# Patient Record
Sex: Male | Born: 1990 | Race: Black or African American | Hispanic: No | Marital: Single | Smoking: Current every day smoker
Health system: Southern US, Community
[De-identification: ages and names within clinical notes are randomized; demographics above are authoritative.]

## PROBLEM LIST (undated history)

## (undated) DIAGNOSIS — S21139A Puncture wound without foreign body of unspecified front wall of thorax without penetration into thoracic cavity, initial encounter: Secondary | ICD-10-CM

## (undated) DIAGNOSIS — R062 Wheezing: Secondary | ICD-10-CM

## (undated) DIAGNOSIS — Z5189 Encounter for other specified aftercare: Secondary | ICD-10-CM

## (undated) DIAGNOSIS — G931 Anoxic brain damage, not elsewhere classified: Secondary | ICD-10-CM

## (undated) DIAGNOSIS — R531 Weakness: Secondary | ICD-10-CM

## (undated) DIAGNOSIS — F319 Bipolar disorder, unspecified: Secondary | ICD-10-CM

## (undated) DIAGNOSIS — IMO0001 Reserved for inherently not codable concepts without codable children: Secondary | ICD-10-CM

## (undated) DIAGNOSIS — R6883 Chills (without fever): Secondary | ICD-10-CM

## (undated) HISTORY — DX: Bipolar disorder, unspecified: F31.9

## (undated) HISTORY — DX: Anoxic brain damage, not elsewhere classified: G93.1

## (undated) HISTORY — DX: Chills (without fever): R68.83

## (undated) HISTORY — DX: Puncture wound without foreign body of unspecified front wall of thorax without penetration into thoracic cavity, initial encounter: S21.139A

## (undated) HISTORY — DX: Reserved for inherently not codable concepts without codable children: IMO0001

## (undated) HISTORY — DX: Wheezing: R06.2

## (undated) HISTORY — DX: Weakness: R53.1

## (undated) HISTORY — DX: Encounter for other specified aftercare: Z51.89

---

## 2010-11-16 ENCOUNTER — Inpatient Hospital Stay (HOSPITAL_COMMUNITY)
Admission: RE | Admit: 2010-11-16 | Discharge: 2010-11-16 | Disposition: A | Discharge status: Home / Self Care | Payer: Self-pay | Source: Ambulatory Visit | Attending: Emergency Medicine | Admitting: Emergency Medicine

## 2011-03-01 ENCOUNTER — Inpatient Hospital Stay (HOSPITAL_COMMUNITY)
Admission: EM | Admit: 2011-03-01 | Discharge: 2011-03-06 | DRG: 237 | Disposition: A | Discharge status: Home Health | Payer: Medicaid Other | Attending: Surgery | Admitting: Surgery

## 2011-03-01 ENCOUNTER — Emergency Department (HOSPITAL_COMMUNITY): Payer: Medicaid Other

## 2011-03-01 ENCOUNTER — Inpatient Hospital Stay (HOSPITAL_COMMUNITY): Payer: Medicaid Other

## 2011-03-01 DIAGNOSIS — D62 Acute posthemorrhagic anemia: Secondary | ICD-10-CM | POA: Diagnosis present

## 2011-03-01 DIAGNOSIS — F319 Bipolar disorder, unspecified: Secondary | ICD-10-CM | POA: Diagnosis present

## 2011-03-01 DIAGNOSIS — S2190XA Unspecified open wound of unspecified part of thorax, initial encounter: Secondary | ICD-10-CM

## 2011-03-01 DIAGNOSIS — T794XXA Traumatic shock, initial encounter: Secondary | ICD-10-CM

## 2011-03-01 DIAGNOSIS — S21109A Unspecified open wound of unspecified front wall of thorax without penetration into thoracic cavity, initial encounter: Secondary | ICD-10-CM

## 2011-03-01 DIAGNOSIS — S271XXA Traumatic hemothorax, initial encounter: Secondary | ICD-10-CM | POA: Diagnosis present

## 2011-03-01 DIAGNOSIS — I314 Cardiac tamponade: Secondary | ICD-10-CM | POA: Diagnosis present

## 2011-03-01 DIAGNOSIS — Y9229 Other specified public building as the place of occurrence of the external cause: Secondary | ICD-10-CM

## 2011-03-01 DIAGNOSIS — S25809A Unspecified injury of other blood vessels of thorax, unspecified side, initial encounter: Secondary | ICD-10-CM | POA: Diagnosis present

## 2011-03-01 DIAGNOSIS — F17200 Nicotine dependence, unspecified, uncomplicated: Secondary | ICD-10-CM | POA: Diagnosis present

## 2011-03-01 DIAGNOSIS — I498 Other specified cardiac arrhythmias: Secondary | ICD-10-CM | POA: Diagnosis not present

## 2011-03-01 DIAGNOSIS — G931 Anoxic brain damage, not elsewhere classified: Secondary | ICD-10-CM | POA: Diagnosis present

## 2011-03-01 DIAGNOSIS — S2690XA Unspecified injury of heart, unspecified with or without hemopericardium, initial encounter: Secondary | ICD-10-CM

## 2011-03-01 DIAGNOSIS — Y998 Other external cause status: Secondary | ICD-10-CM

## 2011-03-01 HISTORY — PX: CARDIAC SURGERY: SHX584

## 2011-03-01 HISTORY — PX: OTHER SURGICAL HISTORY: SHX169

## 2011-03-01 LAB — COMPREHENSIVE METABOLIC PANEL
ALT: 5 U/L (ref 0–53)
AST: 11 U/L (ref 0–37)
Alkaline Phosphatase: 40 U/L (ref 39–117)
CO2: 16 mEq/L — ABNORMAL LOW (ref 19–32)
GFR calc Af Amer: >60 mL/min (ref >60)
Glucose, Bld: 170 mg/dL — ABNORMAL HIGH (ref 70–99)
Potassium: 2.8 mEq/L — ABNORMAL LOW (ref 3.5–5.1)
Sodium: 143 mEq/L (ref 135–145)
Total Protein: 4.3 g/dL — ABNORMAL LOW (ref 6.0–8.3)

## 2011-03-01 LAB — CBC
HCT: 36.8 % — ABNORMAL LOW (ref 39.0–52.0)
HCT: 33.9 % — ABNORMAL LOW (ref 39.0–52.0)
Hemoglobin: 12.7 g/dL — ABNORMAL LOW (ref 13.0–17.0)
MCH: 30 pg (ref 26.0–34.0)
MCV: 88 fL (ref 78.0–100.0)
MCV: 85.4 fL (ref 78.0–100.0)
Platelets: 143 10*3/uL — ABNORMAL LOW (ref 150–400)
RDW: 12.5 % (ref 11.5–15.5)
RDW: 12.5 % (ref 11.5–15.5)
WBC: 18.2 10*3/uL — ABNORMAL HIGH (ref 4.0–10.5)
WBC: 11.7 10*3/uL — ABNORMAL HIGH (ref 4.0–10.5)

## 2011-03-01 LAB — BASIC METABOLIC PANEL
BUN: 7 mg/dL (ref 6–23)
Calcium: 6.2 mg/dL — CL (ref 8.4–10.5)
Chloride: 112 mEq/L (ref 96–112)
Creatinine, Ser: 0.73 mg/dL (ref 0.50–1.35)
GFR calc Af Amer: >60 mL/min (ref >60)

## 2011-03-01 LAB — POCT I-STAT 3, ART BLOOD GAS (G3+)
Patient temperature: 34.4
TCO2: 22 mmol/L (ref 0–100)
pCO2 arterial: 29.2 mmHg — ABNORMAL LOW (ref 35.0–45.0)
pH, Arterial: 7.455 — ABNORMAL HIGH (ref 7.350–7.450)

## 2011-03-01 LAB — POCT I-STAT 4, (NA,K, GLUC, HGB,HCT)
Glucose, Bld: 122 mg/dL — ABNORMAL HIGH (ref 70–99)
Potassium: 4.3 meq/L (ref 3.5–5.1)
Sodium: 144 meq/L (ref 135–145)

## 2011-03-01 LAB — POCT I-STAT, CHEM 8
BUN: 5 mg/dL — ABNORMAL LOW (ref 6–23)
Chloride: 111 meq/L (ref 96–112)
Creatinine, Ser: 0.9 mg/dL (ref 0.50–1.35)
Glucose, Bld: 163 mg/dL — ABNORMAL HIGH (ref 70–99)
Potassium: 2.9 meq/L — ABNORMAL LOW (ref 3.5–5.1)

## 2011-03-01 LAB — URINALYSIS, ROUTINE W REFLEX MICROSCOPIC
Bilirubin Urine: NEGATIVE
Glucose, UA: 100 mg/dL — AB
Specific Gravity, Urine: 1.03 (ref 1.005–1.030)
Urobilinogen, UA: 1 mg/dL (ref 0.0–1.0)
pH: 7 (ref 5.0–8.0)

## 2011-03-01 LAB — HEMOGLOBIN AND HEMATOCRIT, BLOOD: HCT: 31.2 % — ABNORMAL LOW (ref 39.0–52.0)

## 2011-03-01 LAB — URINE MICROSCOPIC-ADD ON

## 2011-03-01 LAB — LACTIC ACID, PLASMA: Lactic Acid, Venous: 7.5 mmol/L — ABNORMAL HIGH (ref 0.5–2.2)

## 2011-03-01 LAB — ABO/RH: ABO/RH(D): B POS

## 2011-03-01 MED ORDER — IOHEXOL 300 MG/ML  SOLN
100.0000 mL | Freq: Once | INTRAMUSCULAR | Status: AC | PRN
  Administered 2011-03-01: 100 mL via INTRAVENOUS

## 2011-03-02 ENCOUNTER — Inpatient Hospital Stay (HOSPITAL_COMMUNITY): Payer: Medicaid Other

## 2011-03-02 DIAGNOSIS — J9600 Acute respiratory failure, unspecified whether with hypoxia or hypercapnia: Secondary | ICD-10-CM

## 2011-03-02 DIAGNOSIS — F39 Unspecified mood [affective] disorder: Secondary | ICD-10-CM

## 2011-03-02 LAB — BASIC METABOLIC PANEL
BUN: 7 mg/dL (ref 6–23)
CO2: 25 mEq/L (ref 19–32)
Calcium: 7.3 mg/dL — ABNORMAL LOW (ref 8.4–10.5)
Chloride: 111 mEq/L (ref 96–112)
Creatinine, Ser: 0.84 mg/dL (ref 0.50–1.35)
GFR calc Af Amer: >60 mL/min (ref >60)
GFR calc non Af Amer: >60 mL/min (ref >60)
Glucose, Bld: 108 mg/dL — ABNORMAL HIGH (ref 70–99)
Potassium: 3.5 mEq/L (ref 3.5–5.1)
Sodium: 141 mEq/L (ref 135–145)

## 2011-03-02 LAB — CBC
HCT: 32.8 % — ABNORMAL LOW (ref 39.0–52.0)
Hemoglobin: 11.6 g/dL — ABNORMAL LOW (ref 13.0–17.0)
MCH: 30.4 pg (ref 26.0–34.0)
MCHC: 35.4 g/dL (ref 30.0–36.0)
MCV: 86.1 fL (ref 78.0–100.0)
Platelets: 134 10*3/uL — ABNORMAL LOW (ref 150–400)
RBC: 3.81 MIL/uL — ABNORMAL LOW (ref 4.22–5.81)
RDW: 12.8 % (ref 11.5–15.5)
WBC: 11 10*3/uL — ABNORMAL HIGH (ref 4.0–10.5)

## 2011-03-02 LAB — POCT I-STAT 3, ART BLOOD GAS (G3+)
Acid-base deficit: 12 mmol/L — ABNORMAL HIGH (ref 0.0–2.0)
Bicarbonate: 23.9 meq/L (ref 20.0–24.0)
O2 Saturation: 100 %
O2 Saturation: 100 %
pCO2 arterial: 34 mmHg — ABNORMAL LOW (ref 35.0–45.0)
pCO2 arterial: 44.9 mmHg (ref 35.0–45.0)
pH, Arterial: 7.422 (ref 7.350–7.450)
pO2, Arterial: 205 mmHg — ABNORMAL HIGH (ref 80.0–100.0)

## 2011-03-02 LAB — MRSA PCR SCREENING: MRSA by PCR: NEGATIVE

## 2011-03-02 LAB — POCT I-STAT 4, (NA,K, GLUC, HGB,HCT)
Glucose, Bld: 241 mg/dL — ABNORMAL HIGH (ref 70–99)
HCT: 31 % — ABNORMAL LOW (ref 39.0–52.0)
Sodium: 140 meq/L (ref 135–145)

## 2011-03-02 LAB — URINE CULTURE
Colony Count: NO GROWTH
Culture  Setup Time: 201208302017

## 2011-03-02 LAB — MAGNESIUM: Magnesium: 1.4 mg/dL — ABNORMAL LOW (ref 1.5–2.5)

## 2011-03-03 ENCOUNTER — Inpatient Hospital Stay (HOSPITAL_COMMUNITY): Payer: Medicaid Other

## 2011-03-03 LAB — CBC
HCT: 37.8 % — ABNORMAL LOW (ref 39.0–52.0)
Hemoglobin: 13.2 g/dL (ref 13.0–17.0)
MCH: 30.3 pg (ref 26.0–34.0)
MCHC: 34.9 g/dL (ref 30.0–36.0)
MCV: 86.9 fL (ref 78.0–100.0)
Platelets: 152 10*3/uL (ref 150–400)
RBC: 4.35 MIL/uL (ref 4.22–5.81)
RDW: 12.7 % (ref 11.5–15.5)
WBC: 13.7 10*3/uL — ABNORMAL HIGH (ref 4.0–10.5)

## 2011-03-03 LAB — BASIC METABOLIC PANEL
BUN: 4 mg/dL — ABNORMAL LOW (ref 6–23)
CO2: 28 mEq/L (ref 19–32)
Calcium: 8.4 mg/dL (ref 8.4–10.5)
Chloride: 106 mEq/L (ref 96–112)
Creatinine, Ser: 0.66 mg/dL (ref 0.50–1.35)
GFR calc Af Amer: >60 mL/min (ref >60)
GFR calc non Af Amer: >60 mL/min (ref >60)
Glucose, Bld: 115 mg/dL — ABNORMAL HIGH (ref 70–99)
Potassium: 4.1 mEq/L (ref 3.5–5.1)
Sodium: 137 mEq/L (ref 135–145)

## 2011-03-04 ENCOUNTER — Inpatient Hospital Stay (HOSPITAL_COMMUNITY): Payer: Medicaid Other

## 2011-03-04 LAB — TYPE AND SCREEN
Unit division: 0
Unit division: 0
Unit division: 0

## 2011-03-04 LAB — CBC
HCT: 35.8 % — ABNORMAL LOW (ref 39.0–52.0)
Hemoglobin: 12.8 g/dL — ABNORMAL LOW (ref 13.0–17.0)
MCH: 30.9 pg (ref 26.0–34.0)
MCHC: 35.8 g/dL (ref 30.0–36.0)
MCV: 86.5 fL (ref 78.0–100.0)
Platelets: 148 10*3/uL — ABNORMAL LOW (ref 150–400)
RBC: 4.14 MIL/uL — ABNORMAL LOW (ref 4.22–5.81)
RDW: 12.4 % (ref 11.5–15.5)
WBC: 11 10*3/uL — ABNORMAL HIGH (ref 4.0–10.5)

## 2011-03-04 LAB — BASIC METABOLIC PANEL
BUN: 5 mg/dL — ABNORMAL LOW (ref 6–23)
CO2: 29 mEq/L (ref 19–32)
Calcium: 9 mg/dL (ref 8.4–10.5)
Chloride: 104 mEq/L (ref 96–112)
Creatinine, Ser: 0.73 mg/dL (ref 0.50–1.35)
GFR calc Af Amer: >60 mL/min (ref >60)
GFR calc non Af Amer: >60 mL/min (ref >60)
Glucose, Bld: 95 mg/dL (ref 70–99)
Potassium: 3.8 mEq/L (ref 3.5–5.1)
Sodium: 139 mEq/L (ref 135–145)
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
## 2011-03-05 LAB — BASIC METABOLIC PANEL
BUN: 5 mg/dL — ABNORMAL LOW (ref 6–23)
CO2: 32 mEq/L (ref 19–32)
Calcium: 8.9 mg/dL (ref 8.4–10.5)
Chloride: 103 mEq/L (ref 96–112)
Creatinine, Ser: 0.73 mg/dL (ref 0.50–1.35)
GFR calc Af Amer: >60 mL/min (ref >60)
GFR calc non Af Amer: >60 mL/min (ref >60)
Glucose, Bld: 102 mg/dL — ABNORMAL HIGH (ref 70–99)
Potassium: 3.8 mEq/L (ref 3.5–5.1)
Sodium: 140 mEq/L (ref 135–145)

## 2011-03-05 LAB — CBC
HCT: 35.2 % — ABNORMAL LOW (ref 39.0–52.0)
Hemoglobin: 12.5 g/dL — ABNORMAL LOW (ref 13.0–17.0)
MCH: 30.5 pg (ref 26.0–34.0)
MCHC: 35.5 g/dL (ref 30.0–36.0)
MCV: 85.9 fL (ref 78.0–100.0)
Platelets: 172 10*3/uL (ref 150–400)
RBC: 4.1 MIL/uL — ABNORMAL LOW (ref 4.22–5.81)
RDW: 12 % (ref 11.5–15.5)
WBC: 9 10*3/uL (ref 4.0–10.5)

## 2011-03-06 LAB — BASIC METABOLIC PANEL
BUN: 6 mg/dL (ref 6–23)
CO2: 33 mEq/L — ABNORMAL HIGH (ref 19–32)
Calcium: 9.7 mg/dL (ref 8.4–10.5)
Chloride: 102 mEq/L (ref 96–112)
Creatinine, Ser: 0.79 mg/dL (ref 0.50–1.35)
GFR calc Af Amer: >60 mL/min (ref >60)
GFR calc non Af Amer: >60 mL/min (ref >60)
Glucose, Bld: 85 mg/dL (ref 70–99)
Potassium: 3.4 mEq/L — ABNORMAL LOW (ref 3.5–5.1)
Sodium: 141 mEq/L (ref 135–145)

## 2011-03-06 LAB — PREPARE FRESH FROZEN PLASMA: Unit division: 0
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
## 2011-03-08 ENCOUNTER — Ambulatory Visit (INDEPENDENT_AMBULATORY_CARE_PROVIDER_SITE_OTHER): Payer: Self-pay | Admitting: Orthopedic Surgery

## 2011-03-08 ENCOUNTER — Encounter (INDEPENDENT_AMBULATORY_CARE_PROVIDER_SITE_OTHER): Payer: Self-pay

## 2011-03-08 VITALS — BP 128/78 | HR 82 | Temp 97.5°F | Ht 73.0 in | Wt 133.0 lb

## 2011-03-08 DIAGNOSIS — S21119A Laceration without foreign body of unspecified front wall of thorax without penetration into thoracic cavity, initial encounter: Secondary | ICD-10-CM

## 2011-03-08 DIAGNOSIS — S21109A Unspecified open wound of unspecified front wall of thorax without penetration into thoracic cavity, initial encounter: Secondary | ICD-10-CM

## 2011-03-08 NOTE — Patient Instructions (Signed)
Keep appointment with Dr. Tyrone Sage and make appointment with mental health provider as soon as possible.
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
## 2011-03-20 ENCOUNTER — Other Ambulatory Visit: Payer: Self-pay | Admitting: Cardiothoracic Surgery

## 2011-03-20 DIAGNOSIS — I309 Acute pericarditis, unspecified: Secondary | ICD-10-CM

## 2011-03-21 DIAGNOSIS — F319 Bipolar disorder, unspecified: Secondary | ICD-10-CM

## 2011-03-21 DIAGNOSIS — G931 Anoxic brain damage, not elsewhere classified: Secondary | ICD-10-CM | POA: Insufficient documentation

## 2011-03-21 DIAGNOSIS — S21139A Puncture wound without foreign body of unspecified front wall of thorax without penetration into thoracic cavity, initial encounter: Secondary | ICD-10-CM

## 2011-03-22 ENCOUNTER — Encounter: Payer: Self-pay | Admitting: Cardiothoracic Surgery

## 2011-03-28 ENCOUNTER — Other Ambulatory Visit: Payer: Self-pay | Admitting: Cardiothoracic Surgery

## 2011-03-28 DIAGNOSIS — S2190XA Unspecified open wound of unspecified part of thorax, initial encounter: Secondary | ICD-10-CM

## 2011-04-02 ENCOUNTER — Ambulatory Visit (INDEPENDENT_AMBULATORY_CARE_PROVIDER_SITE_OTHER): Payer: Self-pay | Admitting: Physician Assistant

## 2011-04-02 ENCOUNTER — Encounter: Payer: Self-pay | Admitting: Physician Assistant

## 2011-04-02 ENCOUNTER — Ambulatory Visit
Admission: RE | Admit: 2011-04-02 | Discharge: 2011-04-02 | Disposition: A | Discharge status: Home / Self Care | Payer: No Typology Code available for payment source | Source: Ambulatory Visit | Attending: Cardiothoracic Surgery | Admitting: Cardiothoracic Surgery

## 2011-04-02 VITALS — BP 115/75 | HR 72 | Resp 16 | Ht 71.0 in | Wt 140.0 lb

## 2011-04-02 DIAGNOSIS — S21139A Puncture wound without foreign body of unspecified front wall of thorax without penetration into thoracic cavity, initial encounter: Secondary | ICD-10-CM

## 2011-04-02 DIAGNOSIS — S2190XA Unspecified open wound of unspecified part of thorax, initial encounter: Secondary | ICD-10-CM

## 2011-04-02 DIAGNOSIS — S279XXA Injury of unspecified intrathoracic organ, initial encounter: Secondary | ICD-10-CM

## 2011-04-02 NOTE — Progress Notes (Signed)
HPI  [name] is status post median sternotomy for mediastinal exploration for bleeding with evacuation of pericardial hematoma and repair of cardiac injury to the right ventrical done by Dr. Tyrone Sage the 30 2012.  Patient presents today for a followup visit.  He was seen by the trauma surgeons since discharge.  He has been released from their office.  Patient does complain of some right shoulder pain today but denies any recent injury to it.  He does state he tried to change to a tire yesterday but was unable to secondary to shoulder pain.  He is up ambulating several times per day a home.  He's tolerating diet well.  He denies any significant incisional pain, shortness of breath, fevers, opening or drainage from any of his incision sites.  Current Outpatient Prescriptions  Medication Sig Dispense Refill  . citalopram (CELEXA) 20 MG tablet Take 20 mg by mouth daily.        Marland Kitchen oxycodone (OXY-IR) 5 MG capsule Take 5 mg by mouth every 4 (four) hours as needed.              Physical Exam  Constitutional: He is oriented to person, place, and time. He appears well-developed and well-nourished.  Cardiovascular: Normal rate, regular rhythm, normal heart sounds and intact distal pulses.   Pulmonary/Chest: Effort normal and breath sounds normal.  Abdominal: Soft. Bowel sounds are normal.  Musculoskeletal:       + tenderness right shoulder No edema noted  Neurological: He is alert and oriented to person, place, and time. He has normal reflexes.  Skin: Skin is warm and dry.       All incisions are clean dry and intact and healing well.   BP 115/75  Pulse 72  Resp 16  Ht 5\' 11"  (1.803 m)  Wt 140 lb (63.504 kg)  BMI 19.53 kg/m2  SpO2 99%   Diagnostic Tests: Patient had a PA and lateral chest x-ray obtained today noted to be stable.  There is no effusion, pneumothorax, or atelectasis noted.  Sternal wires intact.  Impression/Plan:  Patient is progressing well following surgery.  He is to  continue the anterior 4 times per day progressing.  It is okay for patient to start very short distances at this time.  He still noted lifting over 10 pounds for another 6 weeks.  Patient is told if he continues to have right shoulder discomfort he'll need followup by his primary care physician.  I did give patient prescription for Vicodin 5/500 total number of 40.  At this time we'll release patient from the practice but he is to  contact us if he has a surgical concerns.   Patient is in agreement.

## 2011-04-02 NOTE — Patient Instructions (Signed)
Continue bleeding 3 or 4 times per day progressing.  Okay to start driving short distances.  No heavy lifting over 10 pounds for another 6 weeks.  Follow up with primary care physician if right shoulder discomfort does not improve.
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
## 2013-06-07 IMAGING — CR DG CHEST 1V PORT
1 series · 1 of 1 positions shown · non-contrast
Comparison: 03/01/2011 at [DATE] p.m.

CLINICAL DATA: Line placement

PORTABLE CHEST - 1 VIEW

[view not recorded]
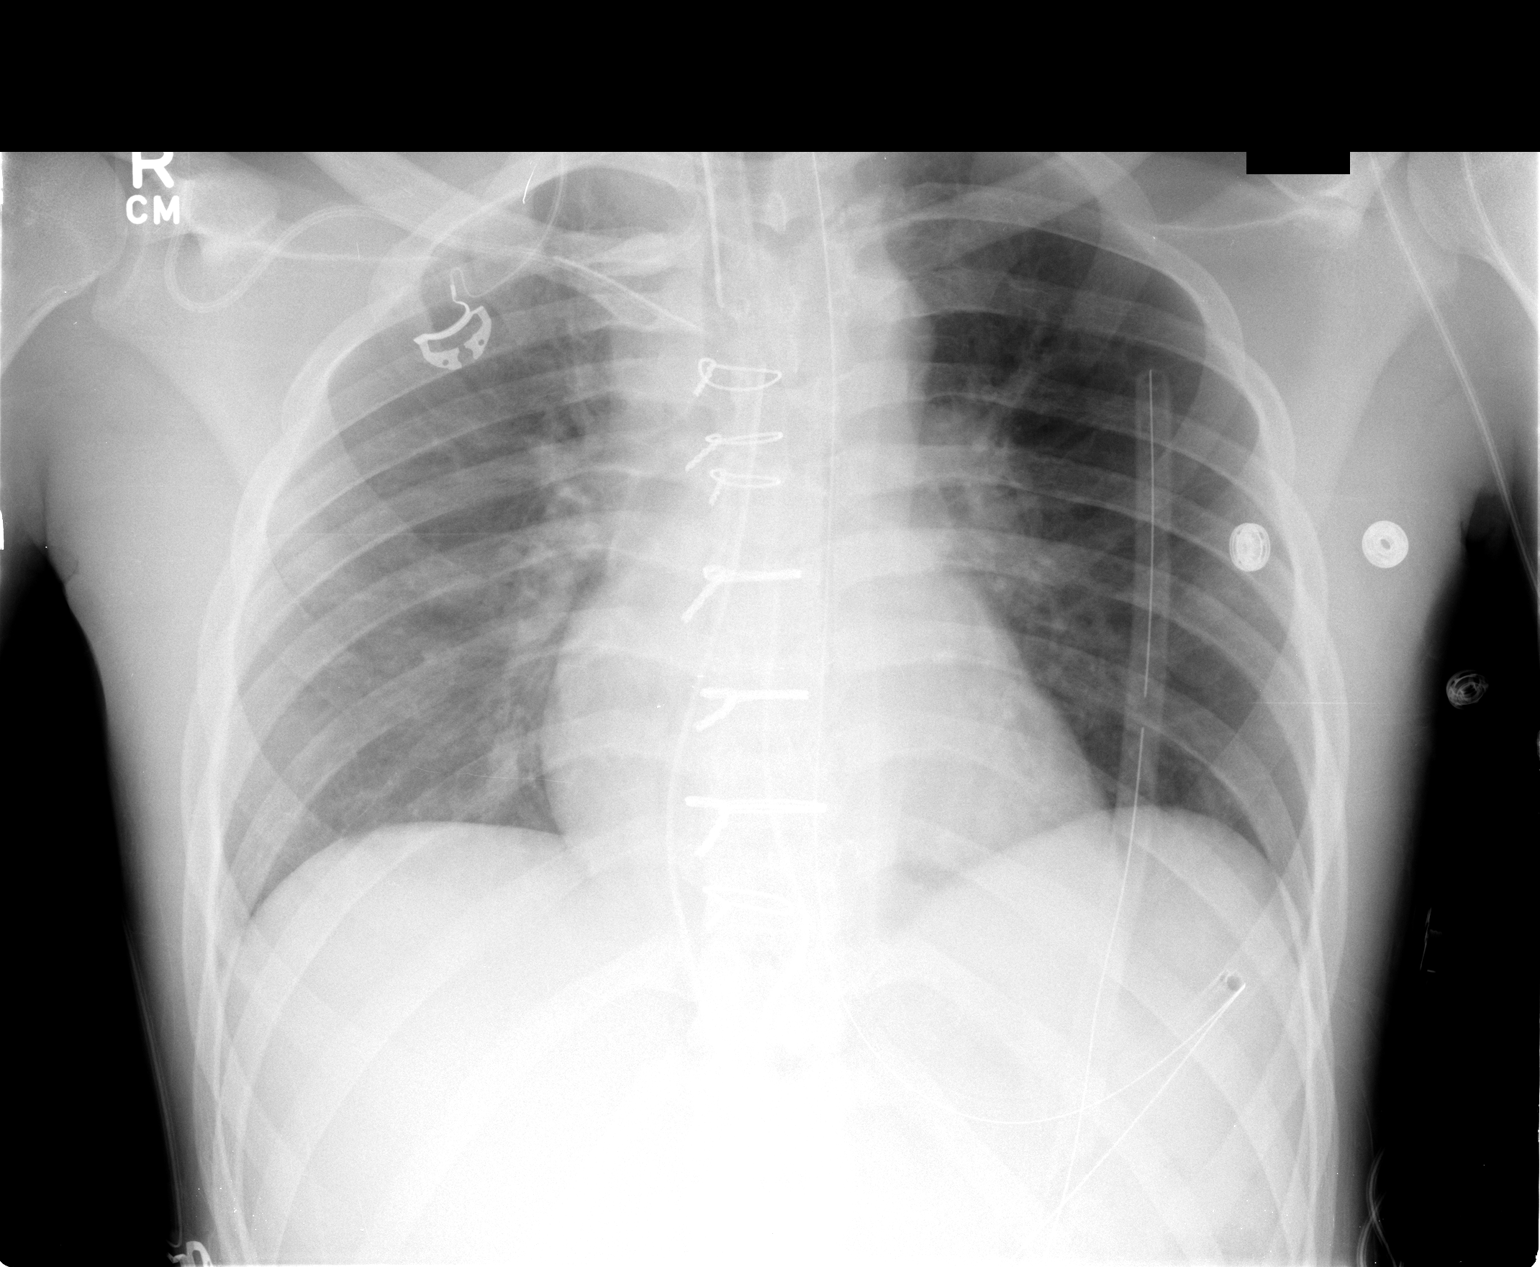

[1 of 1 positions shown; findings below may reference images not displayed]

FINDINGS: The right sided subclavian approach central line tip now
terminates at the brachiocephalic/SVC junction.  No pneumothorax.
No other change.
IMPRESSION: Line adjustment as above.  Findings discussed with Dr. Calixte by
Dr. Miya at the time of imaging.
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
## 2017-07-13 ENCOUNTER — Emergency Department (HOSPITAL_COMMUNITY)
Admission: EM | Admit: 2017-07-13 | Discharge: 2017-07-14 | Disposition: A | Discharge status: Home / Self Care | Payer: No Typology Code available for payment source | Attending: Emergency Medicine | Admitting: Emergency Medicine

## 2017-07-13 ENCOUNTER — Other Ambulatory Visit: Payer: Self-pay

## 2017-07-13 DIAGNOSIS — F17200 Nicotine dependence, unspecified, uncomplicated: Secondary | ICD-10-CM | POA: Insufficient documentation

## 2017-07-13 DIAGNOSIS — Z765 Malingerer [conscious simulation]: Secondary | ICD-10-CM | POA: Insufficient documentation

## 2017-07-13 DIAGNOSIS — M79605 Pain in left leg: Secondary | ICD-10-CM | POA: Insufficient documentation

## 2017-07-13 DIAGNOSIS — Z79899 Other long term (current) drug therapy: Secondary | ICD-10-CM | POA: Insufficient documentation

## 2017-07-13 NOTE — Discharge Instructions (Signed)
You may alternate Tylenol 1000 mg every 6 hours as needed for pain and Ibuprofen 800 mg every 8 hours as needed for pain.  Please take Ibuprofen with food. ° °
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
## 2017-07-14 ENCOUNTER — Emergency Department (HOSPITAL_COMMUNITY): Payer: Self-pay

## 2017-07-14 ENCOUNTER — Emergency Department (HOSPITAL_COMMUNITY)
Admission: EM | Admit: 2017-07-14 | Discharge: 2017-07-14 | Payer: Self-pay | Attending: Emergency Medicine | Admitting: Emergency Medicine

## 2017-07-14 ENCOUNTER — Encounter (HOSPITAL_COMMUNITY): Payer: Self-pay

## 2017-07-14 DIAGNOSIS — R51 Headache: Secondary | ICD-10-CM | POA: Insufficient documentation

## 2017-07-14 DIAGNOSIS — R519 Headache, unspecified: Secondary | ICD-10-CM

## 2017-07-14 DIAGNOSIS — Y939 Activity, unspecified: Secondary | ICD-10-CM | POA: Insufficient documentation

## 2017-07-14 DIAGNOSIS — Y999 Unspecified external cause status: Secondary | ICD-10-CM | POA: Insufficient documentation

## 2017-07-14 DIAGNOSIS — Z79899 Other long term (current) drug therapy: Secondary | ICD-10-CM | POA: Insufficient documentation

## 2017-07-14 DIAGNOSIS — Y929 Unspecified place or not applicable: Secondary | ICD-10-CM | POA: Insufficient documentation

## 2017-07-14 DIAGNOSIS — F319 Bipolar disorder, unspecified: Secondary | ICD-10-CM | POA: Insufficient documentation

## 2017-07-14 DIAGNOSIS — S022XXA Fracture of nasal bones, initial encounter for closed fracture: Secondary | ICD-10-CM | POA: Insufficient documentation

## 2017-07-14 DIAGNOSIS — F17210 Nicotine dependence, cigarettes, uncomplicated: Secondary | ICD-10-CM | POA: Insufficient documentation
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
## 2017-07-14 NOTE — ED Notes (Signed)
PT states understanding of care given, follow up care. PT ambulated from ED to car with a steady gait.  
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
